# Patient Record
Sex: Male | Born: 1960 | Hispanic: Yes | Marital: Married | Smoking: Current every day smoker
Health system: Southern US, Community
[De-identification: ages and names within clinical notes are randomized; demographics above are authoritative.]

## PROBLEM LIST (undated history)

## (undated) DIAGNOSIS — E119 Type 2 diabetes mellitus without complications: Secondary | ICD-10-CM

## (undated) DIAGNOSIS — D696 Thrombocytopenia, unspecified: Secondary | ICD-10-CM

## (undated) DIAGNOSIS — E785 Hyperlipidemia, unspecified: Secondary | ICD-10-CM

## (undated) DIAGNOSIS — I10 Essential (primary) hypertension: Secondary | ICD-10-CM

## (undated) HISTORY — DX: Thrombocytopenia, unspecified: D69.6

## (undated) HISTORY — DX: Type 2 diabetes mellitus without complications: E11.9

## (undated) HISTORY — DX: Essential (primary) hypertension: I10

## (undated) HISTORY — DX: Hyperlipidemia, unspecified: E78.5

---

## 2021-10-23 HISTORY — PX: HERNIA REPAIR: SHX51

## 2022-02-19 ENCOUNTER — Other Ambulatory Visit: Payer: Self-pay | Admitting: Family Medicine

## 2022-02-19 DIAGNOSIS — R7401 Elevation of levels of liver transaminase levels: Secondary | ICD-10-CM

## 2022-02-19 DIAGNOSIS — R748 Abnormal levels of other serum enzymes: Secondary | ICD-10-CM

## 2022-03-17 ENCOUNTER — Other Ambulatory Visit: Payer: Self-pay

## 2022-03-17 ENCOUNTER — Other Ambulatory Visit: Payer: Self-pay | Admitting: Hematology and Oncology

## 2022-03-17 DIAGNOSIS — D696 Thrombocytopenia, unspecified: Secondary | ICD-10-CM

## 2022-03-18 ENCOUNTER — Inpatient Hospital Stay: Payer: 59 | Attending: Hematology and Oncology | Admitting: Hematology and Oncology

## 2022-03-18 ENCOUNTER — Inpatient Hospital Stay: Payer: 59

## 2022-03-18 ENCOUNTER — Telehealth: Payer: Self-pay | Admitting: Hematology and Oncology

## 2022-03-18 ENCOUNTER — Other Ambulatory Visit: Payer: Self-pay | Admitting: Hematology and Oncology

## 2022-03-18 ENCOUNTER — Encounter: Payer: Self-pay | Admitting: Hematology and Oncology

## 2022-03-18 VITALS — BP 136/84 | HR 97 | Temp 98.5°F | Resp 18 | Ht 68.5 in | Wt 169.6 lb

## 2022-03-18 DIAGNOSIS — D72819 Decreased white blood cell count, unspecified: Secondary | ICD-10-CM | POA: Diagnosis not present

## 2022-03-18 DIAGNOSIS — F17210 Nicotine dependence, cigarettes, uncomplicated: Secondary | ICD-10-CM | POA: Insufficient documentation

## 2022-03-18 DIAGNOSIS — I10 Essential (primary) hypertension: Secondary | ICD-10-CM | POA: Diagnosis not present

## 2022-03-18 DIAGNOSIS — D696 Thrombocytopenia, unspecified: Secondary | ICD-10-CM

## 2022-03-18 LAB — VITAMIN B12: Vitamin B-12: 434 pg/mL (ref 180–914)

## 2022-03-18 LAB — CBC AND DIFFERENTIAL
HCT: 44 (ref 41–53)
Hemoglobin: 14.5 (ref 13.5–17.5)
Neutrophils Absolute: 1.7
Platelets: 94 10*3/uL — AB (ref 150–400)
WBC: 3.2

## 2022-03-18 LAB — CBC
MCV: 92 (ref 80–94)
RBC: 4.8 (ref 3.87–5.11)

## 2022-03-18 LAB — FOLATE: Folate: 12.4 ng/mL (ref >5.9)

## 2022-03-18 NOTE — Progress Notes (Cosign Needed)
?[name]  ?[address] ?[place],  [place]  [address] ?[phone] ? ?Clinic Day:  03/18/2022 ? ?Referring physician: Cathleen Corti, FNP ? ? ?REASON FOR CONSULTATION:  ?Thrombocytopenia ? ?HISTORY OF PRESENT ILLNESS:  ?Brent Kline is a 61 y.o. male with thrombocytopenia who is referred in consultation by Cathleen Corti, FNP-C for assessment and management.  He has had mild thrombocytopenia dating back to October 2022 at which time his platelets were 109,000.  Labs from March 16 revealed a white count of 5.1 hemoglobin 14.9 and platelets 139,000.  His glucose was significantly elevated at 396.  He had elevation of the transaminases with an ALT of 58, AST 96 and alk phos 227.  He was started on metformin for diabetes, as well as losartan and atorvastatin for prevention.  He denies other medical problems.Marland Kitchen  He was scheduled for a liver ultrasound, but did not go for that appointment.  On April 4th, his platelet count was 102 and outside peripheral smear revealed platelet aggregation.  He denies abnormal bruising or bleeding.  He has drank heavily in the past, reporting half a liter of whiskey a day.  He stopped daily drinking in February, but will occasionally drink on the weekends. ? ?REVIEW OF SYSTEMS:  ?Review of Systems  ?Constitutional:  Negative for appetite change, chills, fatigue, fever and unexpected weight change.  ?HENT:   Negative for lump/mass, mouth sores and sore throat.   ?Respiratory:  Negative for cough and shortness of breath.   ?Cardiovascular:  Negative for chest pain and leg swelling.  ?Gastrointestinal:  Negative for abdominal pain, constipation, diarrhea, nausea and vomiting.  ?Genitourinary:  Negative for difficulty urinating, dysuria, frequency and hematuria.   ?Musculoskeletal:  Negative for arthralgias, back pain and myalgias.  ?Skin:  Negative for itching, rash and wound.  ?Neurological:  Negative for dizziness, extremity weakness,  headaches, light-headedness and numbness.  ?Hematological:  Negative for adenopathy.  ?Psychiatric/Behavioral:  Negative for depression and sleep disturbance. The patient is not nervous/anxious.    ? ?VITALS:  ?Blood pressure 136/84, pulse 97, temperature 98.5 ?F (36.9 ?C), temperature source Oral, resp. rate 18, height 5' 8.5" (1.74 m), weight 169 lb 9.6 oz (76.9 kg), SpO2 97 %.  ?Wt Readings from Last 3 Encounters:  ?03/18/22 169 lb 9.6 oz (76.9 kg)  ?  ?Body mass index is 25.41 kg/m?. ? ?Performance status (ECOG): 0 - Asymptomatic ? ?PHYSICAL EXAM:  ?Physical Exam ?Vitals and nursing note reviewed.  ?Constitutional:   ?   General: He is not in acute distress. ?   Appearance: Normal appearance. He is normal weight.  ?HENT:  ?   Head: Normocephalic and atraumatic.  ?   Mouth/Throat:  ?   Mouth: Mucous membranes are moist.  ?   Pharynx: Oropharynx is clear. No oropharyngeal exudate or posterior oropharyngeal erythema.  ?Eyes:  ?   General: No scleral icterus. ?   Extraocular Movements: Extraocular movements intact.  ?   Conjunctiva/sclera: Conjunctivae normal.  ?   Pupils: Pupils are equal, round, and reactive to light.  ?Cardiovascular:  ?   Rate and Rhythm: Normal rate and regular rhythm.  ?   Heart sounds: Normal heart sounds. No murmur heard. ?  No friction rub. No gallop.  ?Pulmonary:  ?   Effort: Pulmonary effort is normal.  ?   Breath sounds: Normal breath sounds. No wheezing, rhonchi or rales.  ?Abdominal:  ?   General: Bowel sounds are normal. There is no distension.  ?  Palpations: Abdomen is soft. There is no hepatomegaly (Mild hepatomegaly with the left lobe of the liver palpable about 2 cm below the right costal margin), splenomegaly or mass.  ?   Tenderness: There is no abdominal tenderness.  ?Musculoskeletal:     ?   General: Normal range of motion.  ?   Cervical back: Normal range of motion and neck supple. No tenderness.  ?   Right lower leg: No edema.  ?   Left lower leg: No edema.   ?Lymphadenopathy:  ?   Cervical: No cervical adenopathy.  ?   Upper Body:  ?   Right upper body: No supraclavicular or axillary adenopathy.  ?   Left upper body: No supraclavicular or axillary adenopathy.  ?   Lower Body: No right inguinal adenopathy. No left inguinal adenopathy.  ?Skin: ?   General: Skin is warm and dry.  ?   Coloration: Skin is not jaundiced.  ?   Findings: No rash.  ?Neurological:  ?   Mental Status: He is alert and oriented to person, place, and time.  ?   Cranial Nerves: No cranial nerve deficit.  ?Psychiatric:     ?   Mood and Affect: Mood normal.     ?   Behavior: Behavior normal.     ?   Thought Content: Thought content normal.  ? ? ? ?LABS:  ? ?   ? View : No data to display.  ?  ?  ?  ? ?   ? View : No data to display.  ?  ?  ?  ? ? ? ?No results found for: CEA1 / No results found for: CEA1 ?No results found for: PSA1 ?No results found for: XNT700 ?No results found for: FVC944  ?No results found for: TOTALPROTELP, ALBUMINELP, A1GS, A2GS, BETS, BETA2SER, GAMS, MSPIKE, SPEI ?No results found for: TIBC, FERRITIN, IRONPCTSAT ?No results found for: LDH ? ?STUDIES:  ?No results found.  ? ? ?HISTORY:  ? ?Past Medical History:  ?Diagnosis Date  ? Diabetes mellitus without complication ([place])   ? Hyperlipidemia   ? Hypertension   ? Thrombocytopenia ([place])   ? ? ?Past Surgical History:  ?Procedure Laterality Date  ? HERNIA REPAIR  10/2021  ? ? ?Family History  ?Problem Relation Age of Onset  ? Skin cancer Mother   ? Hypertension Father   ? Diabetes Father   ? ? ?Social History:  reports that he has been smoking cigarettes. He has a 20.00 pack-year smoking history. He has never used smokeless tobacco. He reports that he does not currently use alcohol after a past usage of about 5.0 standard drinks per week. He reports that he does not use drugs.he was born and raised in [place] but immigrated to [place] 23 years ago.  He is married with 2 children.  He works as a Freight forwarder.  The patient is  accompanied by his wife and daughter today. ? ?Allergies: No Known Allergies ? ?Current Medications: ?Current Outpatient Medications  ?Medication Sig Dispense Refill  ? atorvastatin (LIPITOR) 10 MG tablet Take 1 tablet by mouth daily.    ? losartan (COZAAR) 25 MG tablet Take 1 tablet by mouth daily.    ? metFORMIN (GLUCOPHAGE) 1000 MG tablet Take 1 tablet by mouth 2 (two) times daily with a meal.    ? ?No current facility-administered medications for this visit.  ? ? ? ?ASSESSMENT & PLAN:  ? ?Assessment/Plan:  Brent Kline is a 61 y.o. male  with mild thrombocytopenia.  There did not appear to be platelet clumping on his peripheral smear today.  He also has mild leukopenia with a normal differential.  B12 and folate are pending from today.  Most likely liver disease is the cause of his leukopenia and thrombocytopenia.  I advised him of the importance of rescheduling the liver ultrasound as ordered by his primary care provider.  If he indeed has liver disease, he likely will need referral to hepatologist.  I advised him to avoid alcohol and Tylenol.  As he is a current smoker, I discussed smoking cessation with him and provided him with information on the [place] quit line in [place].  I will plan to see him back in 3 months with a repeat CBC for continued observation. ? ? ?I discussed the assessment and plan with the patient.  The patient and his family were provided an opportunity to ask questions and all were answered.  The patient agreed with the plan and demonstrated an understanding of the instructions.  He knows to contact our office if he develops abnormal bruising or bleeding. ? ?The entire visit was conducted through our [place] interpreter service. ? ?Thank you for the opportunity to care for this gentleman. ? ? ?I provided 45 minutes of face-to-face time during this encounter and > 50% was spent counseling as documented under my assessment and plan.  ? ? ?Marvia Pickles, PA-C   ? ? ?  ?

## 2022-03-18 NOTE — Telephone Encounter (Signed)
LVM requesting pt to call office back. ? ?From Belva Crome, PA-C: Help again! I didn't get his daughter's phone number, so do you mind calling to let him know his vitamin B12 and folic acid were normal, so not causing low blood counts? Thank you!! ?

## 2022-03-19 ENCOUNTER — Telehealth: Payer: Self-pay | Admitting: Hematology and Oncology

## 2022-03-19 NOTE — Telephone Encounter (Signed)
Patients daughter notified of lab results. ? ? ?From [facility] 03/18/22: Help again! I didn't get his daughter's phone number, so do you mind calling to let him know his vitamin [id number] and folic acid were normal, so not causing low blood counts? Thank you!! ?

## 2022-03-27 ENCOUNTER — Other Ambulatory Visit: Payer: Self-pay | Admitting: Hematology and Oncology

## 2022-03-27 DIAGNOSIS — D696 Thrombocytopenia, unspecified: Secondary | ICD-10-CM

## 2022-03-27 DIAGNOSIS — D72819 Decreased white blood cell count, unspecified: Secondary | ICD-10-CM

## 2022-04-10 ENCOUNTER — Other Ambulatory Visit: Payer: 59

## 2022-04-16 ENCOUNTER — Ambulatory Visit (INDEPENDENT_AMBULATORY_CARE_PROVIDER_SITE_OTHER): Payer: 59

## 2022-04-16 DIAGNOSIS — R7401 Elevation of levels of liver transaminase levels: Secondary | ICD-10-CM | POA: Diagnosis not present

## 2022-04-16 DIAGNOSIS — R748 Abnormal levels of other serum enzymes: Secondary | ICD-10-CM

## 2022-06-12 ENCOUNTER — Other Ambulatory Visit: Payer: Self-pay

## 2022-06-16 NOTE — Progress Notes (Deleted)
[facility]  [address] [place],  [place]  [address] [phone]  Clinic Day:  06/16/2022  Referring physician: Meda Coffee, FNP   HISTORY OF PRESENT ILLNESS:  The patient is a 61 y.o. male with mild thrombocytopenia. He also has mild leukopenia with a normal differential.  B12 and folate were normal.  The leukopenia and thrombocytopenia were felt to be due to liver disease.  He had been scheduled for a liver ultrasound, but did not keep the appointment, so I encouraged him to reschedule that.  He underwent right upper quadrant ultrasound on May 25 which revealed increased echogenicity and nodular contour of the liver with overall heterogeneous parenchymal appearance. Findings most compatible with steatosis and cirrhosis. Recommend further evaluation of the liver with pre and post contrast-enhanced abdominal MRI given complex appearance on initial ultrasound.  This apparently has not been addressed yet.  Overall, patient continues to do fairly well.  He denies any overt formal blood loss.  Unfortunately, he continues to drink alcohol.  PHYSICAL EXAM:  There were no vitals taken for this visit. Wt Readings from Last 3 Encounters:  03/18/22 169 lb 9.6 oz (76.9 kg)   There is no height or weight on file to calculate BMI.  Performance status (ECOG): {CHL ONC [id number]  Physical Exam Vitals and nursing note reviewed.  Constitutional:      General: He is not in acute distress.    Appearance: Normal appearance. He is normal weight.  HENT:     Head: Normocephalic and atraumatic.     Mouth/Throat:     Mouth: Mucous membranes are moist.     Pharynx: Oropharynx is clear. No oropharyngeal exudate or posterior oropharyngeal erythema.  Eyes:     General: No scleral icterus.    Extraocular Movements: Extraocular movements intact.     Conjunctiva/sclera: Conjunctivae normal.     Pupils: Pupils are equal, round, and reactive to light.  Cardiovascular:      Rate and Rhythm: Normal rate and regular rhythm.     Heart sounds: Normal heart sounds. No murmur heard.    No friction rub. No gallop.  Pulmonary:     Effort: Pulmonary effort is normal.     Breath sounds: Normal breath sounds. No wheezing, rhonchi or rales.  Abdominal:     General: Bowel sounds are normal. There is no distension.     Palpations: Abdomen is soft. There is hepatomegaly. There is no splenomegaly or mass.     Tenderness: There is no abdominal tenderness.  Musculoskeletal:        General: Normal range of motion.     Cervical back: Normal range of motion and neck supple. No tenderness.     Right lower leg: No edema.     Left lower leg: No edema.  Lymphadenopathy:     Cervical: No cervical adenopathy.     Upper Body:     Right upper body: No supraclavicular or axillary adenopathy.     Left upper body: No supraclavicular or axillary adenopathy.     Lower Body: No right inguinal adenopathy. No left inguinal adenopathy.  Skin:    General: Skin is warm and dry.     Coloration: Skin is not jaundiced.     Findings: No rash.  Neurological:     Mental Status: He is alert and oriented to person, place, and time.     Cranial Nerves: No cranial nerve deficit.  Psychiatric:        Mood  and Affect: Mood normal.        Behavior: Behavior normal.        Thought Content: Thought content normal.     LABS:      Latest Ref Rng & Units 03/18/2022   12:00 AM  CBC  WBC  3.2      Hemoglobin 13.5 - 17.5 14.5      Hematocrit 41 - 53 44      Platelets 150 - 400 K/uL 94         This result is from an external source.       No data to display           No results found for: "CEA1", "CEA" / No results found for: "CEA1", "CEA" No results found for: "PSA1" No results found for: "BLT903" No results found for: "CAN125"  No results found for: "TOTALPROTELP", "ALBUMINELP", "A1GS", "A2GS", "BETS", "BETA2SER", "GAMS", "MSPIKE", "SPEI" No results found for: "TIBC", "FERRITIN",  "IRONPCTSAT" No results found for: "LDH"  No results found for: "AFPTUMOR", "TOTALPROTELP", "ALBUMINELP", "A1GS", "A2GS", "BETS", "BETA2SER", "GAMS", "MSPIKE", "SPEI", "LDH", "CEA1", "CEA", "PSA1", "IGASERUM", "IGGSERUM", "IGMSERUM", "THGAB", "THYROGLB"  Review Flowsheet        No data to display           STUDIES:  No results found.    ASSESSMENT & PLAN:   Assessment/Plan:  A 61 y.o. male with mild leukopenia and thrombocytopenia felt to be due to liver disease.  Ultrasound is concerning for hepatosteatosis and cirrhosis.  MRI was recommended, but not scheduled.  I will go ahead and get him scheduled for an MRI abdomen with and without contrast.  If the diagnosis cirrhosis is concerned firmed, we will for him to hepatology.The patient understands all the plans discussed today and is in agreement with them.  He knows contact our office if he develops concerns prior to his next appointment.    Adah Perl, PA-C

## 2022-06-17 ENCOUNTER — Inpatient Hospital Stay: Payer: 59

## 2022-06-17 ENCOUNTER — Ambulatory Visit: Payer: 59 | Admitting: Hematology and Oncology

## 2022-06-29 ENCOUNTER — Inpatient Hospital Stay: Payer: 59

## 2022-06-29 ENCOUNTER — Ambulatory Visit: Payer: 59 | Admitting: Hematology and Oncology

## 2022-06-29 NOTE — Progress Notes (Deleted)
[facility]  [address] [place],  [place]  [address] [phone]  Clinic Day:  06/29/2022  Referring physician: Meda Coffee, FNP   HISTORY OF PRESENT ILLNESS:  The patient is a 61 y.o. male with with thrombocytopenia and leukopenia felt to be due to liver disease who I began seeing in April. He is here today for repeat clinical assessment.  Since his last visit, he did have an abdominal ultrasound which revealed increased echogenicity and nodular contour of the liver with overall heterogeneous parenchymal appearance. Findings most compatible with steatosis and cirrhosis. Recommend further evaluation of the liver with pre and post contrast-enhanced abdominal MRI given complex appearance on initial ultrasound.  No cholelithiasis or sonographic evidence for acute cholecystitis.  PHYSICAL EXAM:  There were no vitals taken for this visit. Wt Readings from Last 3 Encounters:  03/18/22 169 lb 9.6 oz (76.9 kg)   There is no height or weight on file to calculate BMI.  Performance status (ECOG): {CHL ONC [id number]  Physical Exam Vitals and nursing note reviewed.  Constitutional:      General: He is not in acute distress.    Appearance: Normal appearance. He is normal weight.  HENT:     Head: Normocephalic and atraumatic.     Mouth/Throat:     Mouth: Mucous membranes are moist.     Pharynx: Oropharynx is clear. No oropharyngeal exudate or posterior oropharyngeal erythema.  Eyes:     General: No scleral icterus.    Extraocular Movements: Extraocular movements intact.     Conjunctiva/sclera: Conjunctivae normal.     Pupils: Pupils are equal, round, and reactive to light.  Cardiovascular:     Rate and Rhythm: Normal rate and regular rhythm.     Heart sounds: Normal heart sounds. No murmur heard.    No friction rub. No gallop.  Pulmonary:     Effort: Pulmonary effort is normal.     Breath sounds: Normal breath sounds. No wheezing, rhonchi or  rales.  Abdominal:     General: Bowel sounds are normal. There is no distension.     Palpations: Abdomen is soft. There is no mass.     Tenderness: There is no abdominal tenderness.  Musculoskeletal:        General: Normal range of motion.     Cervical back: Normal range of motion and neck supple. No tenderness.     Right lower leg: No edema.     Left lower leg: No edema.  Lymphadenopathy:     Cervical: No cervical adenopathy.     Upper Body:     Right upper body: No supraclavicular or axillary adenopathy.     Left upper body: No supraclavicular or axillary adenopathy.     Lower Body: No right inguinal adenopathy. No left inguinal adenopathy.  Skin:    General: Skin is warm and dry.     Coloration: Skin is not jaundiced.     Findings: No rash.  Neurological:     Mental Status: He is alert and oriented to person, place, and time.     Cranial Nerves: No cranial nerve deficit.  Psychiatric:        Mood and Affect: Mood normal.        Behavior: Behavior normal.        Thought Content: Thought content normal.   LABS:      Latest Ref Rng & Units 03/18/2022   12:00 AM  CBC  WBC  3.2  Hemoglobin 13.5 - 17.5 14.5      Hematocrit 41 - 53 44      Platelets 150 - 400 K/uL 94         This result is from an external source.       No data to display           No results found for: "CEA1", "CEA" / No results found for: "CEA1", "CEA" No results found for: "PSA1" No results found for: "MVE720" No results found for: "CAN125"  No results found for: "TOTALPROTELP", "ALBUMINELP", "A1GS", "A2GS", "BETS", "BETA2SER", "GAMS", "MSPIKE", "SPEI" No results found for: "TIBC", "FERRITIN", "IRONPCTSAT" No results found for: "LDH"  No results found for: "AFPTUMOR", "TOTALPROTELP", "ALBUMINELP", "A1GS", "A2GS", "BETS", "BETA2SER", "GAMS", "MSPIKE", "SPEI", "LDH", "CEA1", "CEA", "PSA1", "IGASERUM", "IGGSERUM", "IGMSERUM", "THGAB", "THYROGLB"  Review Flowsheet        No data to display            STUDIES:  No results found.    ASSESSMENT & PLAN:   Assessment/Plan:  61 y.o. male with ***  The patient understands all the plans discussed today and is in agreement with them.  He knows to contact our office if he develops concerns prior to hisnext appointment    Adah Perl, PA-C

## 2023-03-26 IMAGING — US US ABDOMEN LIMITED
1 series · 14 of 25 positions shown · non-contrast
Comparison: None Available.

CLINICAL DATA: Abnormal LFTs.

EXAM:
ULTRASOUND ABDOMEN LIMITED RIGHT UPPER QUADRANT

[Series 1: us abdomen limited ruq (liver/gb) · 14 of 39 slices shown]
[im 1/39]
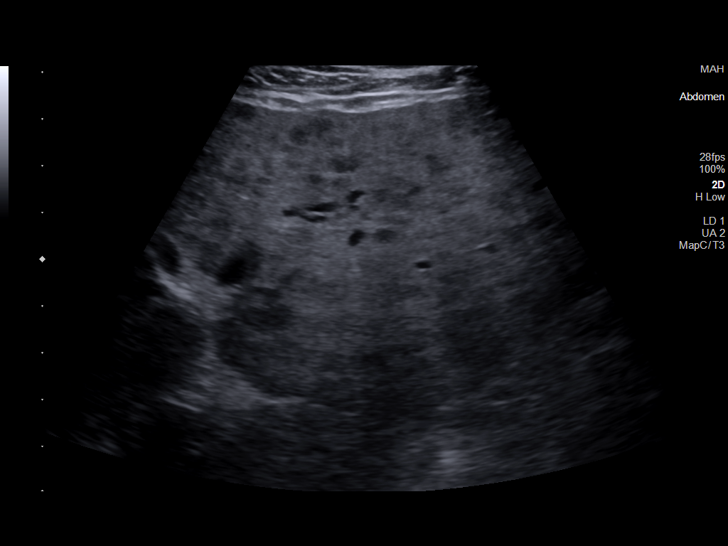
[im 4/39]
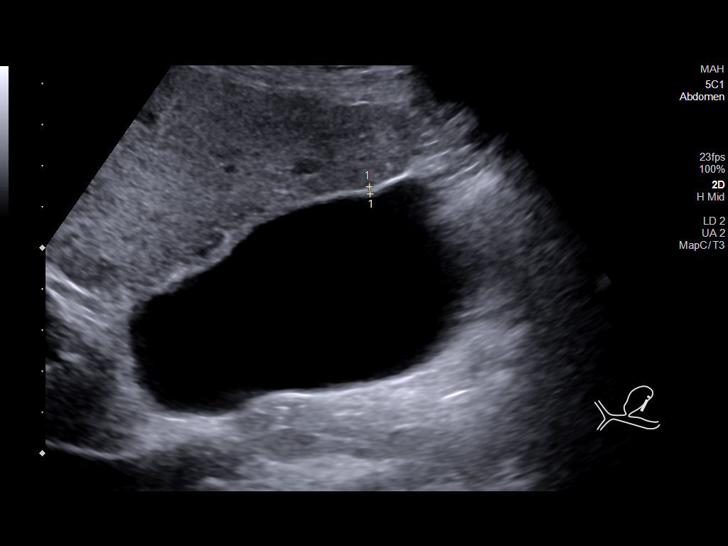
[im 7/39]
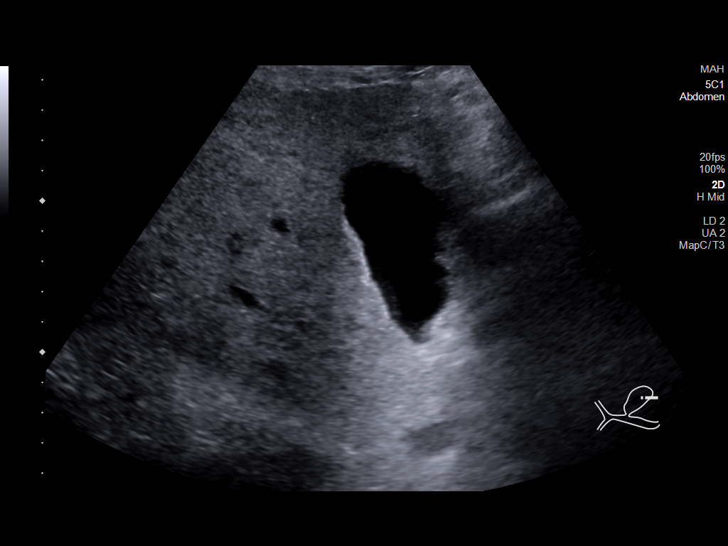
[im 10/39]
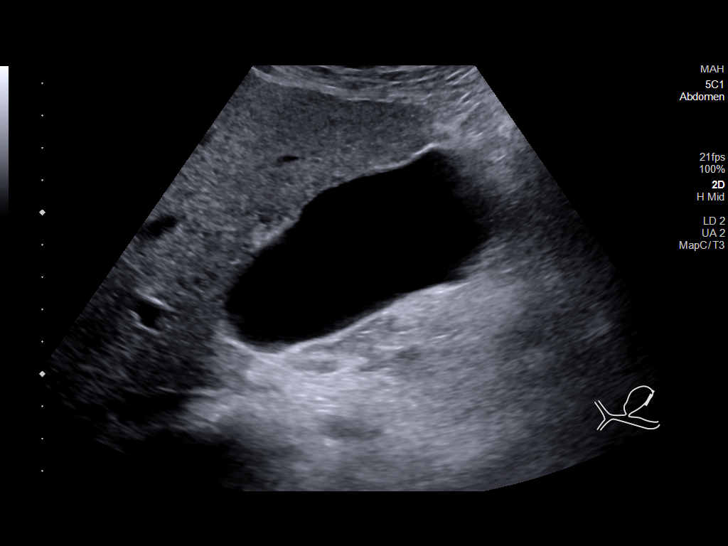
[im 13/39]
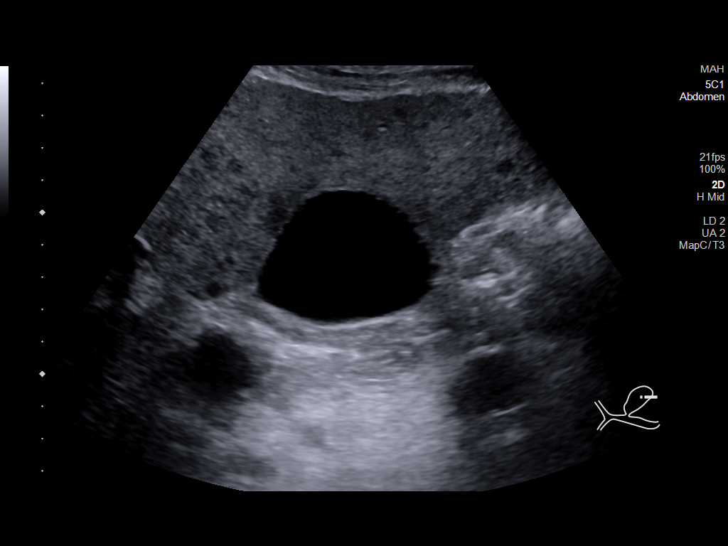
[im 15/39]
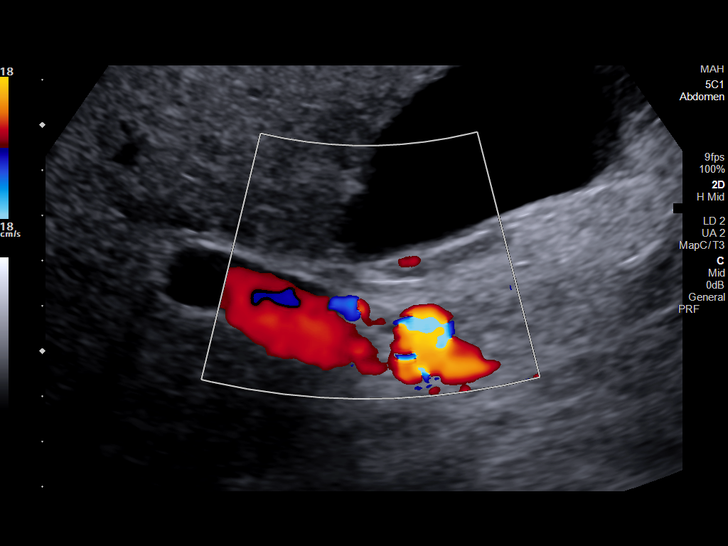
[im 18/39]
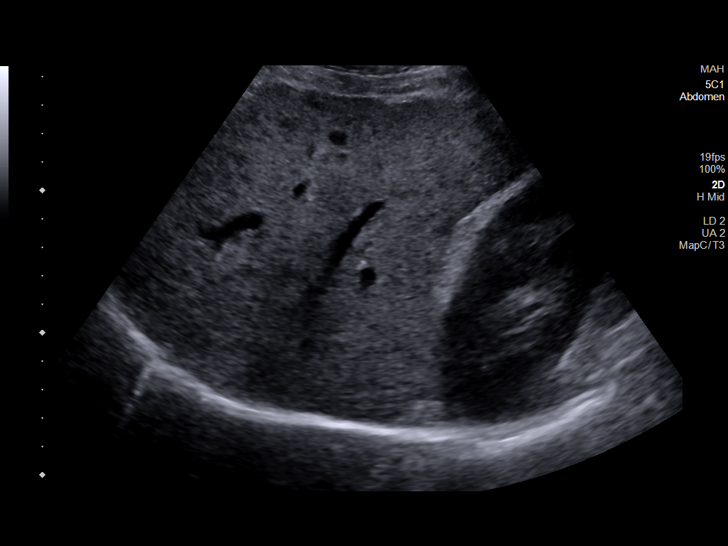
[im 21/39]
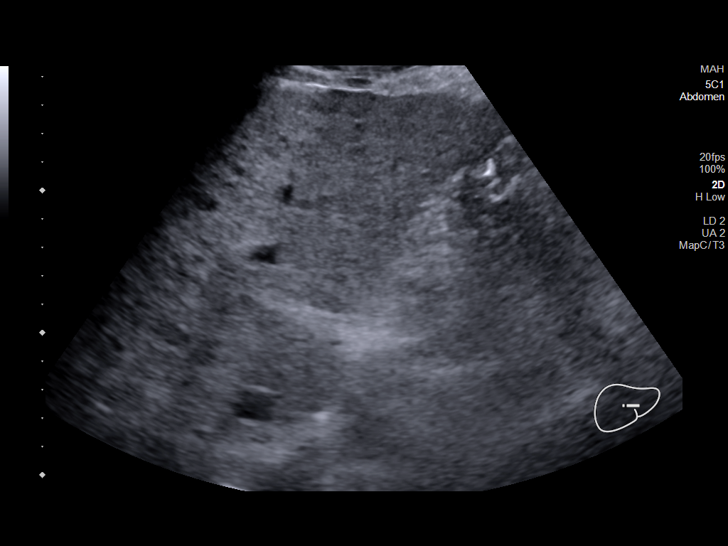
[im 24/39]
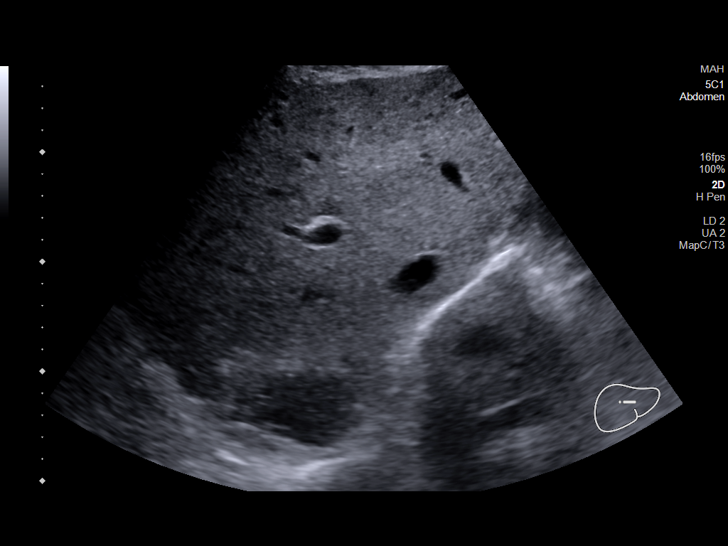
[im 26/39]
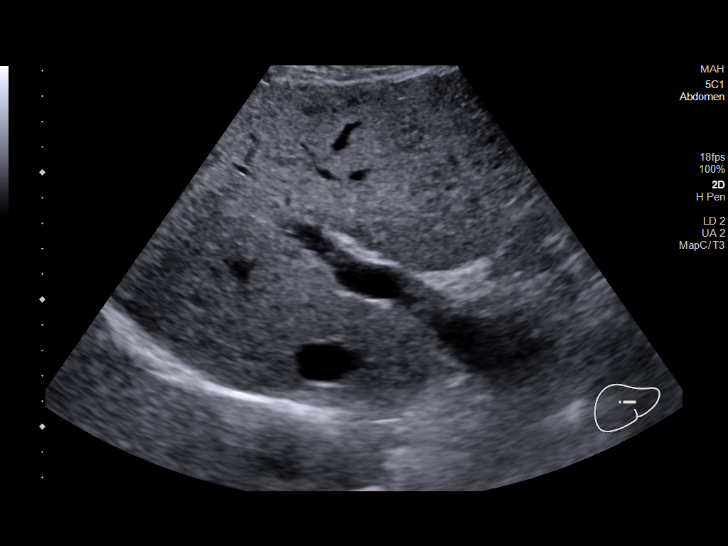
[im 29/39]
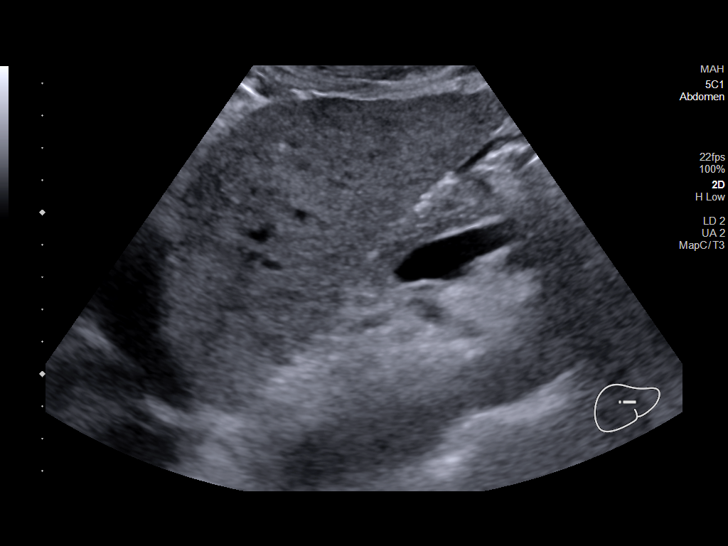
[im 32/39]
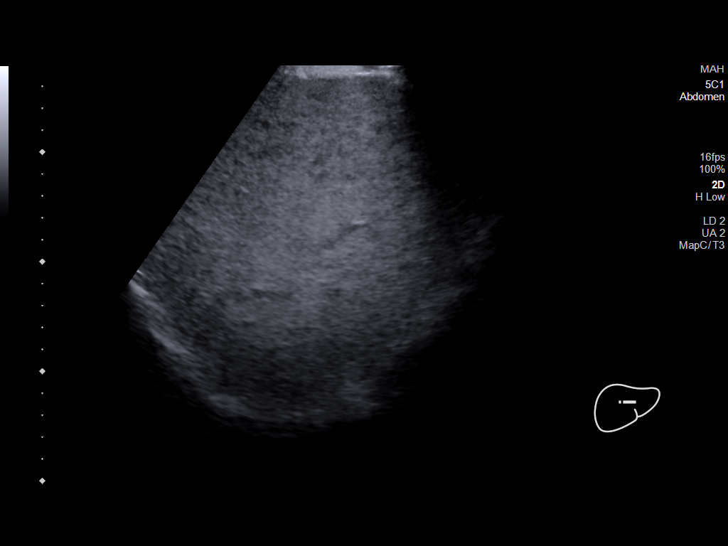
[im 35/39]
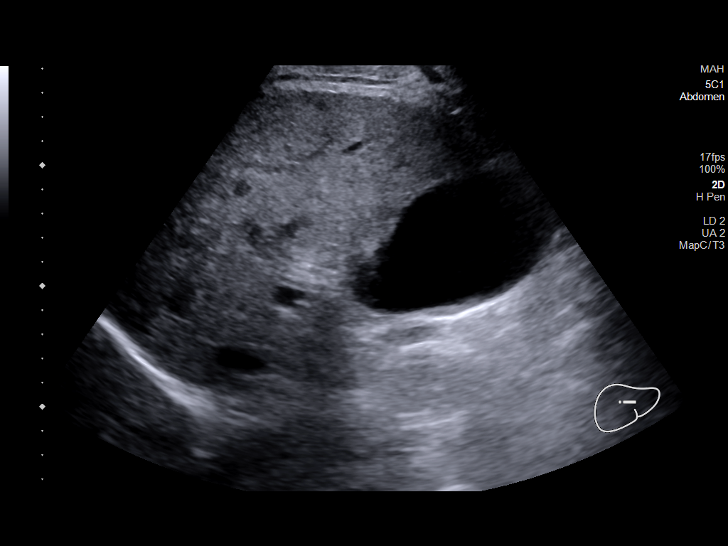
[im 39/39]
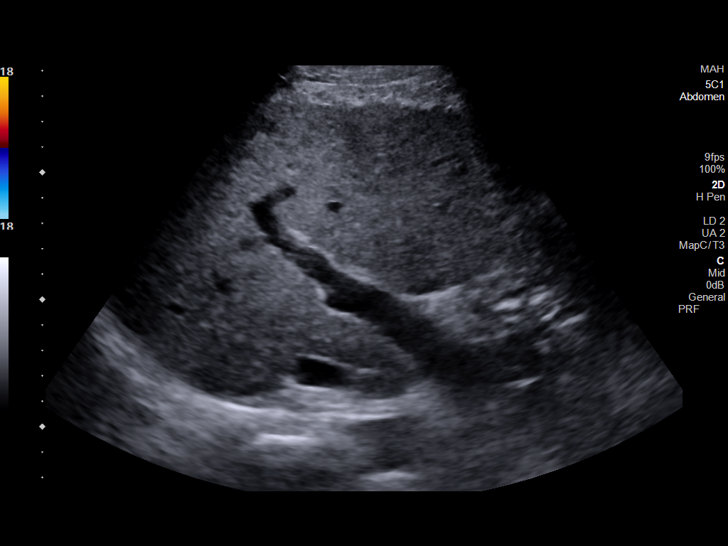

[14 of 25 positions shown; findings below may reference images not displayed]

FINDINGS: Gallbladder:

No gallstones or wall thickening visualized. No sonographic Murphy
sign noted by sonographer.

Common bile duct:

Diameter: 3 mm

Liver:

The liver is increased in echogenicity and nodular in contour. No
definite focal lesion is identified. Portal vein is patent on color
Doppler imaging with normal direction of blood flow towards the
liver.

Other: None.
IMPRESSION: The liver is increased in echogenicity and nodular in contour with
overall heterogeneous parenchymal appearance. Findings most
compatible with steatosis and cirrhosis. Recommend further
evaluation of the liver with pre and post contrast-enhanced
abdominal MRI given complex appearance on initial ultrasound.

No cholelithiasis or sonographic evidence for acute cholecystitis.
